# Patient Record
Sex: Female | Born: 1995 | Race: White | Hispanic: No | Marital: Single | Smoking: Never smoker
Health system: Southern US, Community
[De-identification: ages and names within clinical notes are randomized; demographics above are authoritative.]

## PROBLEM LIST (undated history)

## (undated) DIAGNOSIS — J45909 Unspecified asthma, uncomplicated: Secondary | ICD-10-CM

---

## 2017-04-10 ENCOUNTER — Encounter: Payer: Self-pay | Admitting: Emergency Medicine

## 2017-04-10 ENCOUNTER — Emergency Department
Admission: EM | Admit: 2017-04-10 | Discharge: 2017-04-10 | Disposition: A | Discharge status: Home / Self Care | Payer: Managed Care, Other (non HMO) | Attending: Emergency Medicine | Admitting: Emergency Medicine

## 2017-04-10 ENCOUNTER — Emergency Department: Payer: Managed Care, Other (non HMO)

## 2017-04-10 DIAGNOSIS — N390 Urinary tract infection, site not specified: Secondary | ICD-10-CM | POA: Diagnosis not present

## 2017-04-10 DIAGNOSIS — R1031 Right lower quadrant pain: Secondary | ICD-10-CM | POA: Diagnosis present

## 2017-04-10 DIAGNOSIS — N83291 Other ovarian cyst, right side: Secondary | ICD-10-CM | POA: Diagnosis not present

## 2017-04-10 DIAGNOSIS — Z79899 Other long term (current) drug therapy: Secondary | ICD-10-CM | POA: Insufficient documentation

## 2017-04-10 DIAGNOSIS — J45909 Unspecified asthma, uncomplicated: Secondary | ICD-10-CM | POA: Insufficient documentation

## 2017-04-10 DIAGNOSIS — N83201 Unspecified ovarian cyst, right side: Secondary | ICD-10-CM

## 2017-04-10 HISTORY — DX: Unspecified asthma, uncomplicated: J45.909

## 2017-04-10 LAB — COMPREHENSIVE METABOLIC PANEL
ALBUMIN: 4.2 g/dL (ref 3.5–5.0)
ALT: 18 U/L (ref 14–54)
ANION GAP: 8 (ref 5–15)
AST: 21 U/L (ref 15–41)
Alkaline Phosphatase: 53 U/L (ref 38–126)
BUN: 10 mg/dL (ref 6–20)
CHLORIDE: 109 mmol/L (ref 101–111)
CO2: 24 mmol/L (ref 22–32)
Calcium: 9.1 mg/dL (ref 8.9–10.3)
Creatinine, Ser: 0.67 mg/dL (ref 0.44–1.00)
GFR calc non Af Amer: >60 mL/min (ref >60)
GLUCOSE: 101 mg/dL — AB (ref 65–99)
Potassium: 3.6 mmol/L (ref 3.5–5.1)
SODIUM: 141 mmol/L (ref 135–145)
TOTAL PROTEIN: 7.1 g/dL (ref 6.5–8.1)
Total Bilirubin: 0.5 mg/dL (ref 0.3–1.2)

## 2017-04-10 LAB — URINALYSIS, COMPLETE (UACMP) WITH MICROSCOPIC
Bacteria, UA: NONE SEEN
Bilirubin Urine: NEGATIVE
GLUCOSE, UA: NEGATIVE mg/dL
HGB URINE DIPSTICK: NEGATIVE
KETONES UR: NEGATIVE mg/dL
NITRITE: NEGATIVE
PROTEIN: NEGATIVE mg/dL
Specific Gravity, Urine: 1.017 (ref 1.005–1.030)
pH: 7 (ref 5.0–8.0)

## 2017-04-10 LAB — CBC
HCT: 40.9 % (ref 35.0–47.0)
Hemoglobin: 14.3 g/dL (ref 12.0–16.0)
MCH: 31.2 pg (ref 26.0–34.0)
MCHC: 34.9 g/dL (ref 32.0–36.0)
MCV: 89.3 fL (ref 80.0–100.0)
PLATELETS: 188 10*3/uL (ref 150–440)
RBC: 4.59 MIL/uL (ref 3.80–5.20)
RDW: 12.6 % (ref 11.5–14.5)
WBC: 5.2 10*3/uL (ref 3.6–11.0)

## 2017-04-10 LAB — LIPASE, BLOOD: Lipase: 24 U/L (ref 11–51)

## 2017-04-10 LAB — POCT PREGNANCY, URINE: Preg Test, Ur: NEGATIVE

## 2017-04-10 MED ORDER — IOPAMIDOL (ISOVUE-300) INJECTION 61%
30.0000 mL | Freq: Once | INTRAVENOUS | Status: AC | PRN
  Administered 2017-04-10: 30 mL via ORAL

## 2017-04-10 MED ORDER — CEPHALEXIN 500 MG PO CAPS: 500.0000 mg | ORAL_CAPSULE | Freq: Two times a day (BID) | ORAL | 0 refills | Status: DC

## 2017-04-10 MED ORDER — IOPAMIDOL (ISOVUE-300) INJECTION 61%
100.0000 mL | Freq: Once | INTRAVENOUS | Status: AC | PRN
  Administered 2017-04-10: 100 mL via INTRAVENOUS

## 2017-04-10 NOTE — ED Notes (Signed)
Report to Bill-RN.

## 2017-04-10 NOTE — ED Triage Notes (Signed)
Patient c/o right lower abd pain. Denies hx of ovarian cysts, known pregnancy, vaginal discharge, alterations in urinary status, alterations in bowel movements. +nausea last night.

## 2017-04-10 NOTE — ED Provider Notes (Signed)
[facility] Emergency Department Provider Note   ____________________________________________    I have reviewed the triage vital signs and the nursing notes.   HISTORY  Chief Complaint Abdominal Pain     HPI Kelli Fitzpatrick is a 21 y.o. female Who presents with complaints of right lower quadrant abdominal pain. Patient reports pain for approximately one week but significantly worse over the last 24 hours. She denies fevers or chills. She is never had this pain before. She denies vaginal discharge, no dysuria. She has a Mirena IUD. No nausea or vomiting. No history of abdominal surgery. She has not taken anything for this. Movement seems to make it worse.   Past Medical History:  Diagnosis Date  . Asthma     There are no active problems to display for this patient.   No past surgical history on file.  Prior to Admission medications   Medication Sig Start Date End Date Taking? Authorizing Provider  albuterol (PROVENTIL HFA;VENTOLIN HFA) 108 (90 Base) MCG/ACT inhaler Inhale into the lungs every 4 (four) hours as needed for wheezing or shortness of breath.   Yes [provider]  cephALEXin (KEFLEX) 500 MG capsule Take 1 capsule (500 mg total) by mouth 2 (two) times daily. 04/10/17   Jene EveryKinner, Sterling Mondo, MD     Allergies Patient has no known allergies.  History reviewed. No pertinent family history.  Social History Social History  Substance Use Topics  . Smoking status: Not on file  . Smokeless tobacco: Not on file  . Alcohol use Not on file    Review of Systems  Constitutional: No fever/chills Eyes: No visual changes.  ENT: No sore throat. Cardiovascular: Denies chest pain. Respiratory: Denies shortness of breath. Gastrointestinal:as above Genitourinary: Negative for dysuria. Musculoskeletal: Negative for back pain. Skin: Negative for rash. Neurological: Negative for headaches     ____________________________________________   PHYSICAL EXAM:  VITAL SIGNS: ED Triage Vitals  Enc Vitals Group     BP 04/10/17 0933 135/80     Pulse Rate 04/10/17 0933 94     Resp 04/10/17 0933 16     Temp 04/10/17 0933 97.8 F (36.6 C)     Temp Source 04/10/17 0933 Oral     SpO2 04/10/17 0933 100 %     Weight 04/10/17 0930 68 kg (150 lb)     Height 04/10/17 0930 1.524 m (5')     Head Circumference --      Peak Flow --      Pain Score 04/10/17 0930 7     Pain Loc --      Pain Edu? --      Excl. in GC? --     Constitutional: Alert and oriented. No acute distress. Pleasant and interactive Eyes: Conjunctivae are normal.  Head: Atraumatic. Nose: No congestion/rhinnorhea. Mouth/Throat: Mucous membranes are moist.   Neck:  Painless ROM Cardiovascular: Normal rate, regular rhythm. Grossly normal heart sounds.  Good peripheral circulation. Respiratory: Normal respiratory effort.  No retractions. Lungs CTAB. Gastrointestinal: mild tenderness to palpation right lower quadrant. No distention.  No CVA tenderness. Genitourinary: deferred Musculoskeletal: No lower extremity tenderness nor edema.  Warm and well perfused Neurologic:  Normal speech and language. No gross focal neurologic deficits are appreciated.  Skin:  Skin is warm, dry and intact. No rash noted. Psychiatric: Mood and affect are normal. Speech and behavior are normal.  ____________________________________________   LABS (all labs ordered are listed, but only abnormal results are displayed)  Labs Reviewed  COMPREHENSIVE METABOLIC PANEL - Abnormal; Notable for the following:       Result Value   Glucose, Bld 101 (*)    All other components within normal limits  URINALYSIS, COMPLETE (UACMP) WITH MICROSCOPIC - Abnormal; Notable for the following:    Color, Urine YELLOW (*)    APPearance HAZY (*)    Leukocytes, UA LARGE (*)    Squamous Epithelial / LPF 6-30 (*)    All other components within normal limits   CBC  LIPASE, BLOOD  POCT PREGNANCY, URINE   ____________________________________________  EKG   ____________________________________________  RADIOLOGY  Ct abd/pelv unremarkable, small ovarian cyst noted, discussed with patient the need for follow-up 6-8 weeks. ____________________________________________   PROCEDURES  Procedure(s) performed: No    Critical Care performed: No ____________________________________________   INITIAL IMPRESSION / ASSESSMENT AND PLAN / ED COURSE  Pertinent labs & imaging results that were available during my care of the patient were reviewed by me and considered in my medical decision making (see chart for details).  Patient presented with right lower quadrant abdominal pain with tenderness in that area. We will check labs, obtain CT head and pelvis to rule out appendicitis  Lab work and CT are greatly reassuring. Small ovarian cyst noted, offered ultrasound with the patient declined. She will follow with her PCP. Well appearing and in no distress, recommend NSAIDs for pain    ____________________________________________   FINAL CLINICAL IMPRESSION(S) / ED DIAGNOSES  Final diagnoses:  Cyst of right ovary  Lower urinary tract infectious disease      NEW MEDICATIONS STARTED DURING THIS VISIT:  Discharge Medication List as of 04/10/2017 12:40 PM    START taking these medications   Details  cephALEXin (KEFLEX) 500 MG capsule Take 1 capsule (500 mg total) by mouth 2 (two) times daily., Starting Fri 04/10/2017, Print         Note:  This document was prepared using Dragon voice recognition software and may include unintentional dictation errors.    Jene Every, MD 04/10/17 [phone]

## 2017-04-10 NOTE — ED Notes (Signed)
Patient transported to CT 

## 2018-07-13 ENCOUNTER — Ambulatory Visit (INDEPENDENT_AMBULATORY_CARE_PROVIDER_SITE_OTHER): Payer: Managed Care, Other (non HMO) | Admitting: Obstetrics and Gynecology

## 2018-07-13 ENCOUNTER — Encounter: Payer: Self-pay | Admitting: Obstetrics and Gynecology

## 2018-07-13 VITALS — BP 104/60 | HR 94 | Ht 59.0 in | Wt 141.0 lb

## 2018-07-13 DIAGNOSIS — R102 Pelvic and perineal pain: Secondary | ICD-10-CM | POA: Diagnosis not present

## 2018-07-13 NOTE — Progress Notes (Signed)
Gynecology Pelvic Pain Evaluation   Chief Complaint:  Chief Complaint  Patient presents with  . Lower right side pain    Has Mirena x2 years sporadic lower right side pain    History of Present Illness:   Patient is a 22 y.o. G0P0000 who LMP was Patient's last menstrual period was 06/12/2018., presents today for a problem visit.  She complains of pelvic pain.   Her pain is localized to the LLQ area, described as sharp, began several months ago and its severity is described as moderate. The pain radiates to the  Non-radiating. She has no associated symptoms. Patient has these modifiers which include nothing that make it better and unable to associate with any factor that make it worse.  In particular the pain does not appear to be directly tied to menstrual cycle or breakthrough bleeding on IUD although she has noted some more of this.  No changes in bowl movement or voiding.  Previous evaluation: none. Prior Diagnosis: none. Previous Treatment: none.  03/23/2018 GC/CT negative.  Does have a Mirena which was placed May 2017  Review of Systems: Review of Systems  Constitutional: Negative for chills and fever.  Gastrointestinal: Positive for abdominal pain. Negative for constipation, diarrhea, heartburn, nausea and vomiting.  Genitourinary: Negative.  Negative for dysuria, flank pain, frequency, hematuria and urgency.    Past Medical History:  Past Medical History:  Diagnosis Date  . Asthma     Past Surgical History:  History reviewed. No pertinent surgical history.  Gynecologic History:  Patient's last menstrual period was 06/12/2018. Contraception: IUD. Last Pap: 03/23/2018 Results were: ASCUS HPV positive  Obstetric History: G0P0000  Family History:  History reviewed. No pertinent family history.  Social History:  Social History   Socioeconomic History  . Marital status: Single    Spouse name: Not on file  . Number of children: Not on file  . Years of education:  Not on file  . Highest education level: Not on file  Occupational History  . Not on file  Social Needs  . Financial resource strain: Not on file  . Food insecurity:    Worry: Not on file    Inability: Not on file  . Transportation needs:    Medical: Not on file    Non-medical: Not on file  Tobacco Use  . Smoking status: Never Smoker  . Smokeless tobacco: Never Used  Substance and Sexual Activity  . Alcohol use: Yes  . Drug use: Never  . Sexual activity: Yes    Partners: Male    Birth control/protection: IUD  Lifestyle  . Physical activity:    Days per week: Not on file    Minutes per session: Not on file  . Stress: Not on file  Relationships  . Social connections:    Talks on phone: Not on file    Gets together: Not on file    Attends religious service: Not on file    Active member of club or organization: Not on file    Attends meetings of clubs or organizations: Not on file    Relationship status: Not on file  . Intimate partner violence:    Fear of current or ex partner: Not on file    Emotionally abused: Not on file    Physically abused: Not on file    Forced sexual activity: Not on file  Other Topics Concern  . Not on file  Social History Narrative  . Not on file  Allergies:  No Known Allergies  Medications: Prior to Admission medications   Medication Sig Start Date End Date Taking? Authorizing Provider  albuterol (PROVENTIL HFA;VENTOLIN HFA) 108 (90 Base) MCG/ACT inhaler Inhale into the lungs every 4 (four) hours as needed for wheezing or shortness of breath.   Yes [provider]  levonorgestrel (MIRENA) 20 MCG/24HR IUD by Intrauterine route.   Yes [provider]    Physical Exam Vitals: Blood pressure 104/60, pulse 94, height 4\' 11"  (1.499 m), weight 141 lb (64 kg), last menstrual period 06/12/2018.  General: NAD HEENT: normocephalic, anicteric Pulmonary: No increased work of breathing Genitourinary:  External: Normal external  female genitalia.  Normal urethral meatus, normal  Bartholin's and Skene's glands.    Vagina: Normal vaginal mucosa, no evidence of prolapse.    Cervix: Grossly normal in appearance, no bleeding, IUD string flush with cervix 0.5cm  Uterus: Non-enlarged, mobile, normal contour.  No CMT  Adnexa: ovaries non-enlarged, no adnexal masses  Rectal: deferred  Lymphatic: no evidence of inguinal lymphadenopathy Extremities: no edema, erythema, or tenderness Neurologic: Grossly intact Psychiatric: mood appropriate, affect full  Female chaperone present for pelvic portion of the physical exam     Assessment: 22 y.o. G0P0000 with intermittent left lower quadrant pain  Problem List Items Addressed This Visit    None    Visit Diagnoses    Pelvic pain    -  Primary   Relevant Orders   US Transvaginal Non-OB       1) We discussed the possible etiologies for pelvic pain in women.  Gynecologic causes may include endometriosis, adenomyosis, pelvic inflammatory disease (PID), ovarian cysts, ovarian or tubal torsion, and in rare case gynecologic malignancy such as cervical, uterine, or ovarian cancer.  In addition thee possibility of non-gynecologic etiologies such as urinary or GI tract pathology or disordered, as well as musculoskeletal problems.  The goal is to complete a basic work up in hopes of identifying the underlying cause which in turn will dictate treatment.  In the meantime supportive measures such as localized heat, and NSAIDs are reasonable first steps.     - Prescription drug database was not reviewed, UDS was not ordered - Transvaginal ultrasound ordered - Blood work obtained today No  - Cervical cultures reviewed 03/23/2018   2) We discussed that 80% of the population will have exposure to human papilloma virus (HPV) during their lifetime.  HPV is a large group of viruses, and are also the causative virus for common warts and genital warts.  The pap smear tests for 13 high risk HPV  strains that have some association with cervical cancer, but do not cause visual lesion such as warts.  HPV type 16 and 18 have the highest association with cervical cancer.  The vast majority of HPV infections will be uncomplicated at clear spontaneously in 12-18 months in non-immunocompromised patients.  Patient with compromised immune systems, those taking immunosuppressive drugs, or smoker have shown to have a lower clearance rate and higher persistence of HPV infection.    Currently there are no FDA approved treatments to promote HPV clearance.  Gardasil vaccination is available to prevent HPV infection, but this is only beneficial pre-exposure.  Abstaining from intercourse will not increase clearance  Lastly we stressed that if properly followed HPV should not lead to cervical cancer.   The goal of screening is to identify patient who develop precancerous lesions of the cervix and treat these prior to progression to frank cervical cancer.  The incidence  of cervical cancer is 7 cases per 100,000 women in the [place] a year.  This relatively low rate is in part due to [place] screening as well as vaccination efforts.   - recommend 1 year repeat pap  3) Return in about 1 week (around 07/20/2018) for TVUS and follow up.   Vena AustriaAndreas Khloei Spiker, MD, Evern CoreFACOG [facility], [facility] 07/13/2018, 2:33 PM

## 2018-07-22 ENCOUNTER — Encounter: Payer: Self-pay | Admitting: Obstetrics and Gynecology

## 2018-07-22 ENCOUNTER — Ambulatory Visit (INDEPENDENT_AMBULATORY_CARE_PROVIDER_SITE_OTHER): Payer: Managed Care, Other (non HMO) | Admitting: Obstetrics and Gynecology

## 2018-07-22 ENCOUNTER — Ambulatory Visit (INDEPENDENT_AMBULATORY_CARE_PROVIDER_SITE_OTHER): Payer: Managed Care, Other (non HMO)

## 2018-07-22 VITALS — BP 102/60 | Ht 59.0 in | Wt 142.0 lb

## 2018-07-22 DIAGNOSIS — N83202 Unspecified ovarian cyst, left side: Secondary | ICD-10-CM | POA: Diagnosis not present

## 2018-07-22 DIAGNOSIS — R102 Pelvic and perineal pain: Secondary | ICD-10-CM

## 2018-07-22 NOTE — Progress Notes (Signed)
   Patient ID: Kelli RogersClaudia Altic, female   DOB: 09/16/1995, 22 y.o.   MRN: [id number]  Reason for Consult: Follow-up   Referred by Vena AustriaStaebler, Andreas, MD  Subjective:     HPI:  Kelli Fitzpatrick is a 22 y.o. female. She is here for pelvic pain.  Right sided pelvic pain Pain with intercourse in some positions Pain when doing weight lifting  Gynecological History  Patient's last menstrual period was 07/21/2018 (exact date).   History of fibroids, polyps, or ovarian cysts? : no  History of PCOS? no Hstory of Endometriosis? no History of abnormal pap smears? no Have you had any sexually transmitted infections in the past? no   Past Medical History:  Diagnosis Date  . Asthma    History reviewed. No pertinent family history. History reviewed. No pertinent surgical history.  Short Social History:  Social History   Tobacco Use  . Smoking status: Never Smoker  . Smokeless tobacco: Never Used  Substance Use Topics  . Alcohol use: Yes    No Known Allergies  Current Outpatient Medications  Medication Sig Dispense Refill  . albuterol (PROVENTIL HFA;VENTOLIN HFA) 108 (90 Base) MCG/ACT inhaler Inhale into the lungs every 4 (four) hours as needed for wheezing or shortness of breath.    . levonorgestrel (MIRENA) 20 MCG/24HR IUD by Intrauterine route.     No current facility-administered medications for this visit.    REVIEW OF SYSTEMS      Objective:  Objective   Vitals:   07/22/18 1537  BP: 102/60  Weight: 142 lb (64.4 kg)  Height: 4\' 11"  (1.499 m)   Body mass index is 28.68 kg/m.  Physical Exam  Assessment/Plan:     [age] yo with right sided pelvic pain Does not want to try removing or replacing IUD.  Discussed pain diary and gentle stretches.  Follow up in 2-3 months   Adelene Idlerhristanna Ozell Juhasz MD [facility], [facility] 12/02/2020 1:31 PM

## 2018-09-22 ENCOUNTER — Ambulatory Visit: Payer: Managed Care, Other (non HMO) | Admitting: Obstetrics and Gynecology

## 2018-09-24 IMAGING — CT CT ABD-PELV W/ CM
2 of 4 series · 16 of 46 positions shown, 18 images · IV contrast (APPLIED)
Comparison: None.

CLINICAL DATA: Patient c/o right lower abd pain. Denies hx of
ovarian cysts, known pregnancy, vaginal discharge, alterations in
urinary status, alterations in bowel movements. +nausea last night.
Neg preg test today.

EXAM:
CT ABDOMEN AND PELVIS WITH CONTRAST
TECHNIQUE: Multidetector CT imaging of the abdomen and pelvis was performed
using the standard protocol following bolus administration of
intravenous contrast.
CONTRAST:  100mL 3HYBM7-VPP IOPAMIDOL (3HYBM7-VPP) INJECTION 61%

[Series 2: axial st · axial · 0.68mm/px · z∈[-579,-139]mm · 13 of 96 slices shown, 15 images]
[im 4/96  soft-tissue]
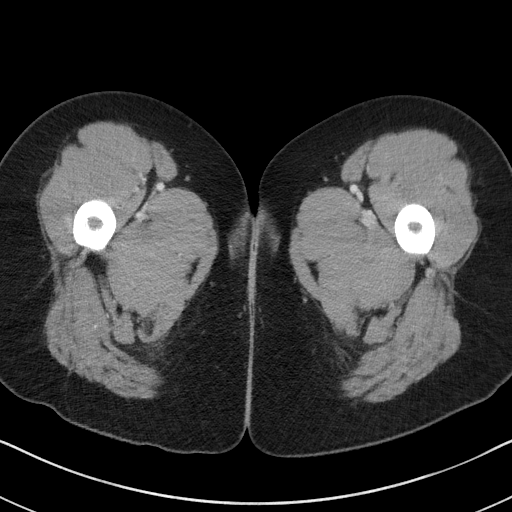
[im 4/96  bone]
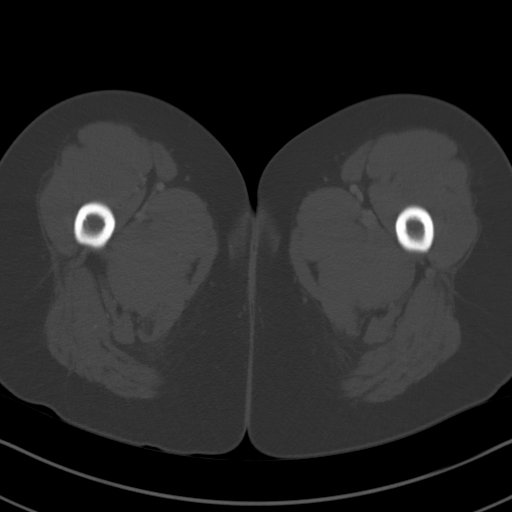
[im 12/96  soft-tissue]
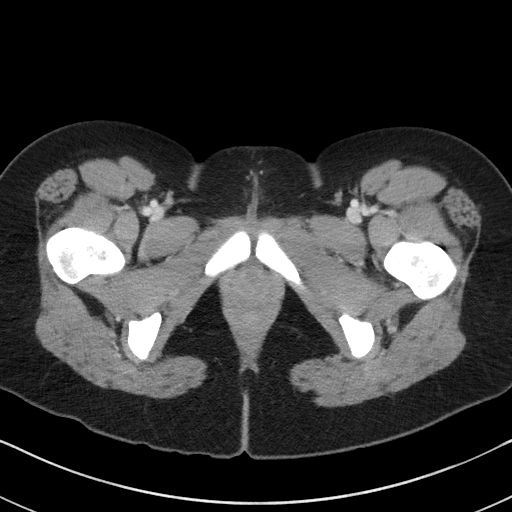
[im 20/96  soft-tissue]
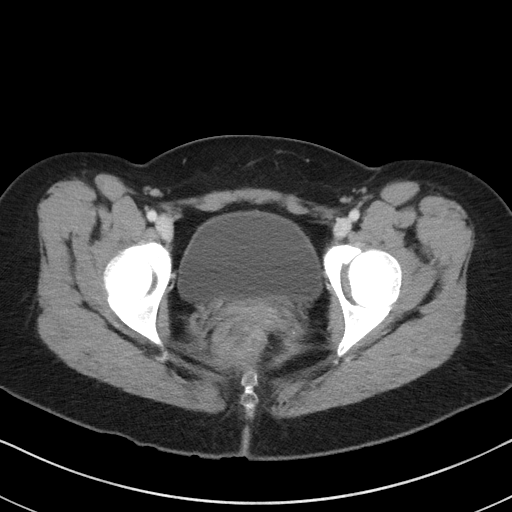
[im 28/96  soft-tissue]
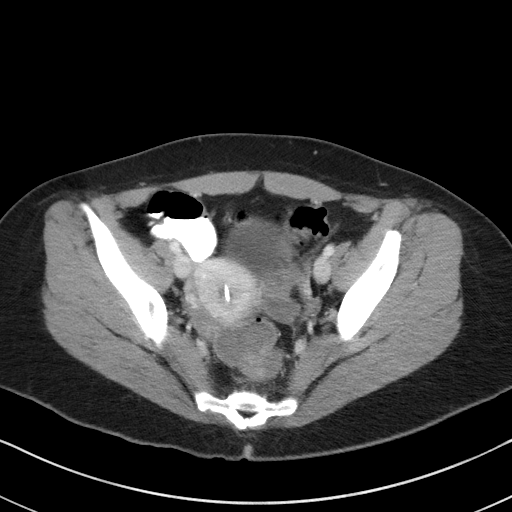
[im 32/96  soft-tissue]
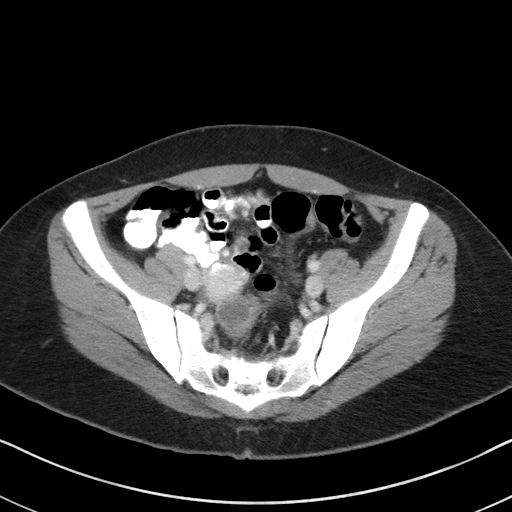
[im 40/96  soft-tissue]
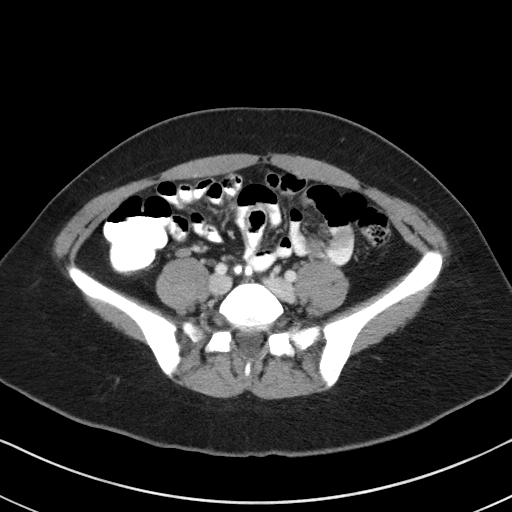
[im 48/96  soft-tissue]
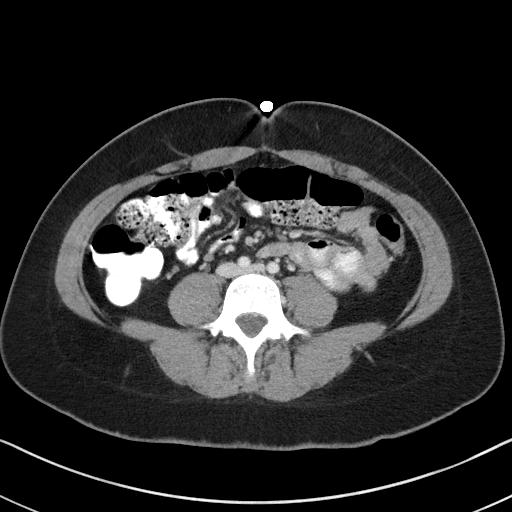
[im 56/96  soft-tissue]
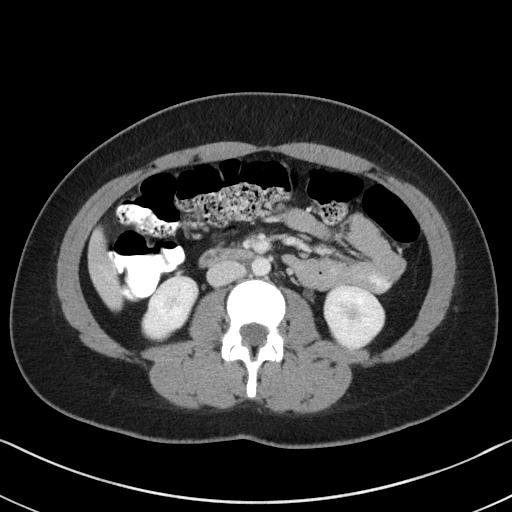
[im 64/96  soft-tissue]
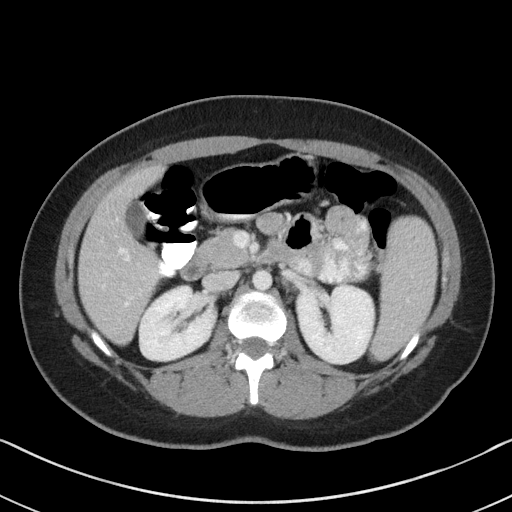
[im 64/96  bone]
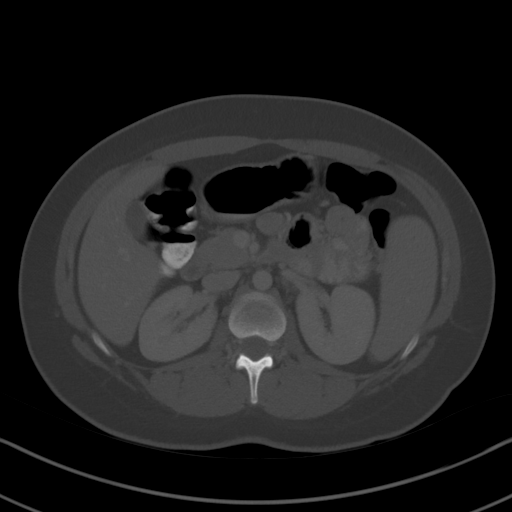
[im 68/96  soft-tissue]
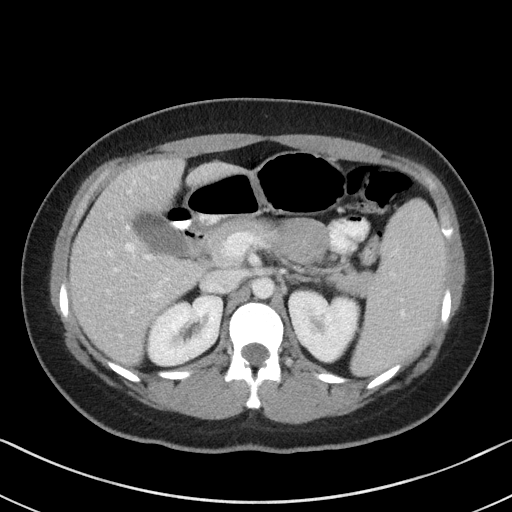
[im 76/96  soft-tissue]
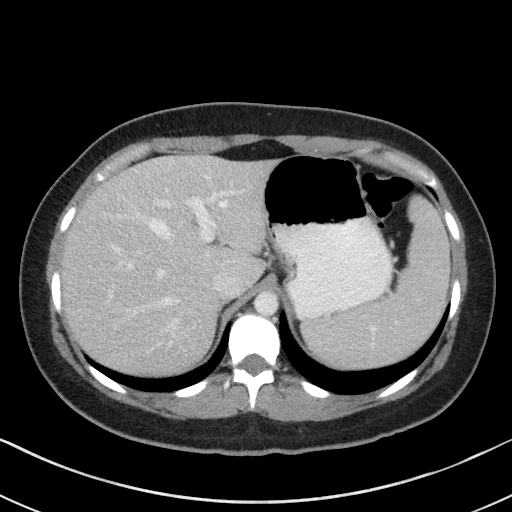
[im 84/96  soft-tissue]
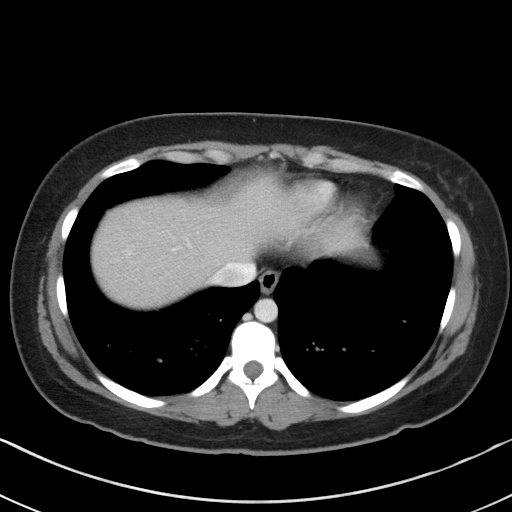
[im 92/96  soft-tissue]
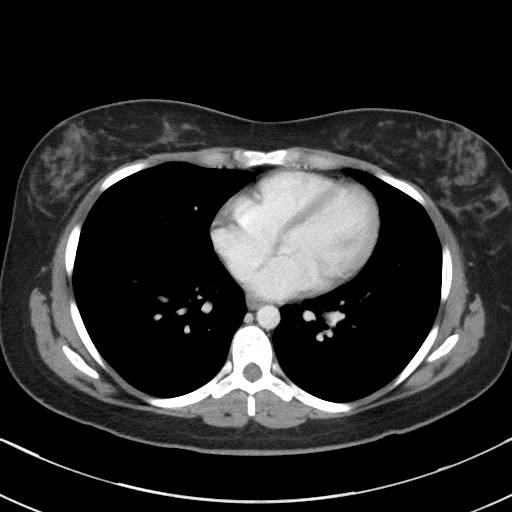

[Series 5: coronal st · coronal · 0.68mm/px · 3 of 82 slices shown]
[im 28/82  soft-tissue]
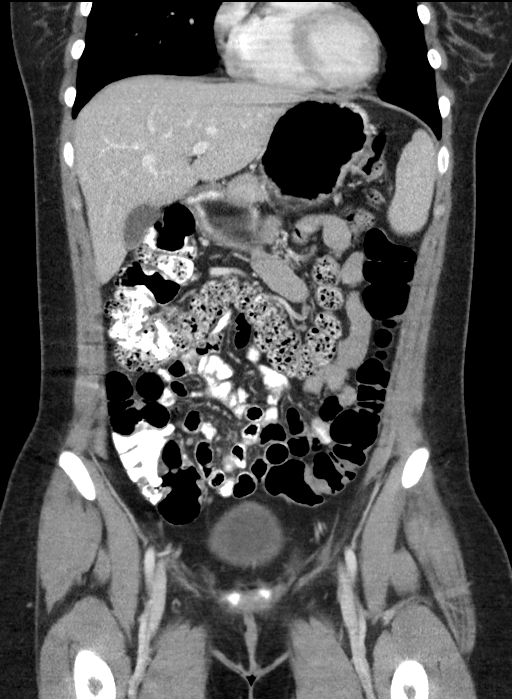
[im 37/82  soft-tissue]
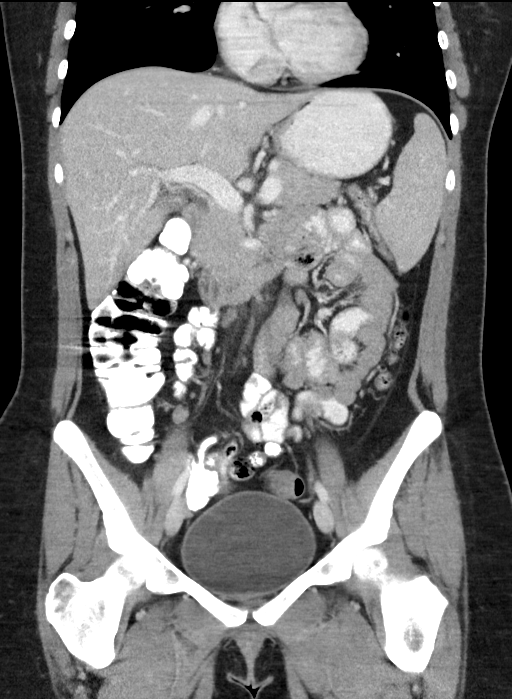
[im 46/82  soft-tissue]
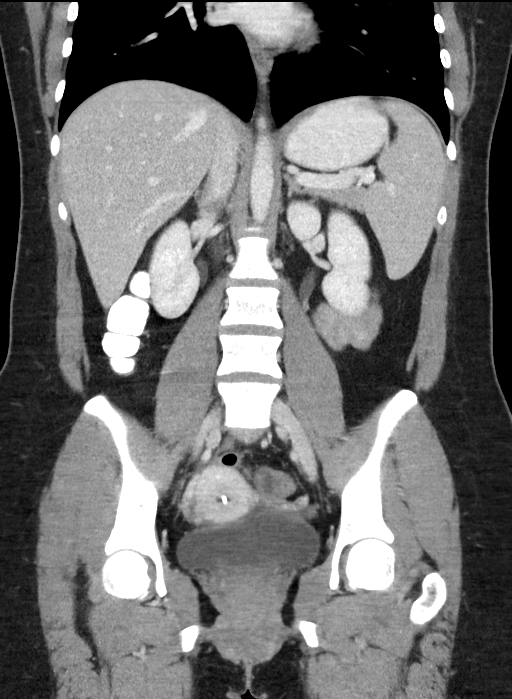

[16 of 46 positions shown; findings below may reference images not displayed]

FINDINGS: Lower chest: Heart size is normal. No imaged pericardial effusion or
significant coronary artery calcifications. No pulmonary nodules,
pleural effusions, or infiltrates.

Hepatobiliary: No focal liver abnormality is seen. No gallstones,
gallbladder wall thickening, or biliary dilatation.

Pancreas: Unremarkable. No pancreatic ductal dilatation or
surrounding inflammatory changes.

Spleen: Normal in size without focal abnormality.

Adrenals/Urinary Tract: Adrenal glands are unremarkable. Kidneys are
normal, without renal calculi, focal lesion, or hydronephrosis.
Bladder is unremarkable.

Stomach/Bowel: The stomach and small bowel loops are normal in
appearance. The appendix is well seen and has a normal appearance.
Colon is normal in appearance.

Vascular/Lymphatic: No significant vascular findings are present. No
enlarged abdominal or pelvic lymph nodes.

Reproductive: Uterus is present. Centrally located IUD is present,
in expected location. A right adnexal low-attenuation structure is
1.8 cm, likely ovarian cyst. Region of the left ovary is normal in
appearance.

Other: No free pelvic fluid.

Musculoskeletal: No acute or significant osseous findings.
IMPRESSION: 1. Probable right ovarian corpus luteum cyst. Consider pelvic
ultrasound for further evaluation, given the patient's right lower
abdominal pain.
2. Normal appendix.
3. No urinary tract obstruction.
4. IUD in normal position.

## 2020-12-02 ENCOUNTER — Encounter: Payer: Self-pay | Admitting: Obstetrics and Gynecology
